# Patient Record
Sex: Male | Born: 1970 | Race: White | Hispanic: No | Marital: Single | Smoking: Never smoker
Health system: Southern US, Community
[De-identification: ages and names within clinical notes are randomized; demographics above are authoritative.]

## PROBLEM LIST (undated history)

## (undated) DIAGNOSIS — E7800 Pure hypercholesterolemia, unspecified: Secondary | ICD-10-CM

---

## 2006-04-27 ENCOUNTER — Emergency Department (HOSPITAL_COMMUNITY): Admission: EM | Admit: 2006-04-27 | Discharge: 2006-04-27 | Payer: Self-pay | Admitting: Emergency Medicine

## 2007-02-03 ENCOUNTER — Emergency Department (HOSPITAL_COMMUNITY): Admission: EM | Admit: 2007-02-03 | Discharge: 2007-02-03 | Payer: Self-pay | Admitting: Emergency Medicine

## 2008-12-08 ENCOUNTER — Emergency Department (HOSPITAL_BASED_OUTPATIENT_CLINIC_OR_DEPARTMENT_OTHER): Admission: EM | Admit: 2008-12-08 | Discharge: 2008-12-08 | Payer: Self-pay | Admitting: Emergency Medicine

## 2008-12-08 ENCOUNTER — Ambulatory Visit: Payer: Self-pay | Admitting: Diagnostic Radiology

## 2011-02-03 LAB — I-STAT 8, (EC8 V) (CONVERTED LAB)
Bicarbonate: 22.1
Chloride: 105
Glucose, Bld: 100 — ABNORMAL HIGH
Glucose, Bld: 79
HCT: 46
HCT: 72 — ABNORMAL HIGH
Hemoglobin: 15.6
Operator id: 265201
Potassium: 3.2 — ABNORMAL LOW
Potassium: 3.8
Sodium: 138
Sodium: 138
TCO2: 25
pCO2, Ven: 41.8 — ABNORMAL LOW

## 2011-02-03 LAB — CBC
HCT: 43.8
Hemoglobin: 15.1
MCHC: 34.5

## 2011-02-03 LAB — DIFFERENTIAL
Basophils Relative: 1
Eosinophils Absolute: 0.1
Eosinophils Relative: 1
Lymphocytes Relative: 33
Monocytes Absolute: 0.7
Monocytes Relative: 12 — ABNORMAL HIGH
Neutrophils Relative %: 54

## 2011-02-03 LAB — URINALYSIS, ROUTINE W REFLEX MICROSCOPIC
Glucose, UA: NEGATIVE
Protein, ur: NEGATIVE
Specific Gravity, Urine: 1.013

## 2011-02-03 LAB — POCT I-STAT CREATININE: Creatinine, Ser: 1

## 2011-02-03 LAB — URINE MICROSCOPIC-ADD ON

## 2016-03-29 MED FILL — BUPROPION HCL XL 300 MG TAB: 300 | 90 days supply | Qty: 90 | Fill #0

## 2016-03-29 MED FILL — OMEPRAZOLE 20 MG CAPSULE DR: 20 | 90 days supply | Qty: 90 | Fill #0

## 2016-07-04 MED FILL — ATORVASTATIN 80 MG TABLET: 80 | 90 days supply | Qty: 90 | Fill #0

## 2016-07-28 MED FILL — BUPROPION HCL XL 300 MG TAB: 300 | 90 days supply | Qty: 90 | Fill #1

## 2016-07-28 MED FILL — OMEPRAZOLE 20 MG CAPSULE DR: 20 | 90 days supply | Qty: 90 | Fill #1

## 2016-08-02 ENCOUNTER — Encounter (HOSPITAL_COMMUNITY): Payer: Self-pay | Admitting: *Deleted

## 2016-08-02 ENCOUNTER — Ambulatory Visit (HOSPITAL_COMMUNITY)
Admission: EM | Admit: 2016-08-02 | Discharge: 2016-08-02 | Disposition: A | Discharge status: Home / Self Care | Payer: 59 | Attending: Internal Medicine | Admitting: Internal Medicine

## 2016-08-02 DIAGNOSIS — J40 Bronchitis, not specified as acute or chronic: Secondary | ICD-10-CM

## 2016-08-02 DIAGNOSIS — R062 Wheezing: Secondary | ICD-10-CM

## 2016-08-02 DIAGNOSIS — R05 Cough: Secondary | ICD-10-CM

## 2016-08-02 HISTORY — DX: Pure hypercholesterolemia, unspecified: E78.00

## 2016-08-02 MED ORDER — ALBUTEROL SULFATE HFA 108 (90 BASE) MCG/ACT IN AERS: 1.0000 | INHALATION_SPRAY | Freq: Four times a day (QID) | RESPIRATORY_TRACT | 0 refills | Status: AC | PRN

## 2016-08-02 MED ORDER — DM-GUAIFENESIN ER 30-600 MG PO TB12: 1.0000 | ORAL_TABLET | Freq: Two times a day (BID) | ORAL | 0 refills | Status: DC

## 2016-08-02 MED ORDER — METHYLPREDNISOLONE 4 MG PO TBPK: ORAL_TABLET | ORAL | 0 refills | Status: DC

## 2016-08-02 MED ORDER — AZITHROMYCIN 250 MG PO TABS: 250.0000 mg | ORAL_TABLET | Freq: Every day | ORAL | 0 refills | Status: DC

## 2016-08-02 MED ORDER — ALBUTEROL SULFATE (2.5 MG/3ML) 0.083% IN NEBU
2.5000 mg | INHALATION_SOLUTION | Freq: Once | RESPIRATORY_TRACT | Status: AC
  Administered 2016-08-02: 2.5 mg via RESPIRATORY_TRACT

## 2016-08-02 MED ORDER — ALBUTEROL SULFATE (2.5 MG/3ML) 0.083% IN NEBU
INHALATION_SOLUTION | RESPIRATORY_TRACT | Status: AC
  Filled 2016-08-02: qty 3

## 2016-08-02 MED FILL — AZITHROMYCIN 250 MG TABLET: 250 | 5 days supply | Qty: 6 | Fill #0

## 2016-08-02 MED FILL — VENTOLIN HFA 90 MCG INHALER: 108 (90 BAS | 25 days supply | Qty: 18 | Fill #0

## 2016-08-02 MED FILL — METHYLPREDNISOLONE 4 MG TAB: 4 | 6 days supply | Qty: 21 | Fill #0

## 2016-08-02 NOTE — Discharge Instructions (Signed)
Pt given ABX Rx with instructions to hold x 48-72 hrs. If no improvement or Sx worsens start the antibiotic as prescribed.

## 2016-08-02 NOTE — ED Triage Notes (Signed)
Cough  X  3   Weeks    Cough  Has  Became  Productive    Several  Days  Ago  He  Developed  A  Fever       Pt   Reports       No  Other  Symptoms  Pt  Is   Sitting upright on  The  Exam table in no  Acute  Distress   Speaking in   Complete   sentances

## 2016-08-02 NOTE — ED Provider Notes (Signed)
CSN: [id number]     Arrival date & time 08/02/16  1047 History   None    Chief Complaint  Patient presents with  . Cough   (Consider location/radiation/quality/duration/timing/severity/associated sxs/prior Treatment) The history is provided by the patient.  Cough  Cough characteristics:  Productive Sputum characteristics:  Yellow and clear Severity:  Moderate Onset quality:  Gradual Duration:  3 weeks Timing:  Constant Progression:  Worsening Chronicity:  New Relieved by:  Nothing : 46 y/o male presented with CC of non productive cough x 3 weeks, productive with yellow sputum since Sunday. Denies fever/chills,SOB,CP. Pt A&Ox3. No acute distress. Vitals WDL.  Past Medical History:  Diagnosis Date  . Hypercholesteremia    History reviewed. No pertinent surgical history. History reviewed. No pertinent family history. Social History  Substance Use Topics  . Smoking status: Never Smoker  . Smokeless tobacco: Not on file  . Alcohol use Yes     Comment: occasonally    Review of Systems  Constitutional: Negative.   HENT: Negative.   Eyes: Negative.   Respiratory: Positive for cough.   Gastrointestinal: Negative.   Endocrine: Negative.   Genitourinary: Negative.     Allergies  Patient has no known allergies.  Home Medications   Prior to Admission medications   Medication Sig Start Date End Date Taking? Authorizing Provider  atorvastatin (LIPITOR) 80 MG tablet Take 80 mg by mouth daily.   Yes Historical Provider, MD  BuPROPion HCl (WELLBUTRIN PO) Take by mouth.   Yes Historical Provider, MD  fluticasone (FLONASE) 50 MCG/ACT nasal spray Place into both nostrils daily.   Yes Historical Provider, MD  omeprazole (PRILOSEC) 20 MG capsule Take 20 mg by mouth daily.   Yes Historical Provider, MD  albuterol (PROVENTIL HFA;VENTOLIN HFA) 108 (90 Base) MCG/ACT inhaler Inhale 1-2 puffs into the lungs every 6 (six) hours as needed for wheezing or shortness of breath. 08/02/16    Todd Crocker, NP  azithromycin (ZITHROMAX) 250 MG tablet Take 1 tablet (250 mg total) by mouth daily. Take first 2 tablets together, then 1 every day until finished. 08/02/16   Todd Zaccone, NP  dextromethorphan-guaiFENesin (MUCINEX DM) 30-600 MG 12hr tablet Take 1 tablet by mouth 2 (two) times daily. 08/02/16   Todd Mesquita, NP  methylPREDNISolone (MEDROL DOSEPAK) 4 MG TBPK tablet Take as directed 08/02/16   Todd Mcnamara, NP   Meds Ordered and Administered this Visit   Medications  albuterol (PROVENTIL) (2.5 MG/3ML) 0.083% nebulizer solution 2.5 mg (2.5 mg Nebulization Given 08/02/16 1215)    BP 125/82 (BP Location: Right Arm)   Pulse 78   Temp 98.1 F (36.7 C) (Oral)   Resp 18   SpO2 96%  No data found.   Physical Exam  Constitutional: He appears well-developed and well-nourished. No distress.  HENT:  Head: Normocephalic.  Eyes: Pupils are equal, round, and reactive to light.  Neck: Normal range of motion.  Cardiovascular: Normal rate, regular rhythm and normal heart sounds.   Pulmonary/Chest: Effort normal. No respiratory distress. He has wheezes (Diffuse expiratory wheezing. ). He has no rales. He exhibits no tenderness.  Abdominal: Soft.    Urgent Care Course     Procedures (including critical care time)  Labs Review Labs Reviewed - No data to display  Imaging Review No results found.   Visual Acuity Review  Right Eye Distance:   Left Eye Distance:   Bilateral Distance:    Right Eye Near:   Left Eye Near:    Bilateral Near:  MDM   1. Bronchitis   2. Wheezing   Pt given ABX Rx with instructions to hold x 48-72 hrs. If no improvement or Sx worsens start the antibiotic as prescribed.   Todd Carbonneau, NP 08/02/16 1219

## 2016-10-18 DIAGNOSIS — G4700 Insomnia, unspecified: Secondary | ICD-10-CM | POA: Diagnosis not present

## 2016-10-18 DIAGNOSIS — Z0000 Encounter for general adult medical examination without abnormal findings: Secondary | ICD-10-CM | POA: Diagnosis not present

## 2016-10-18 MED FILL — ATORVASTATIN 80 MG TABLET: 80 | 90 days supply | Qty: 90 | Fill #1

## 2016-10-19 MED FILL — OMEPRAZOLE 20 MG CAPSULE DR: 20 | 90 days supply | Qty: 90 | Fill #0

## 2016-10-19 MED FILL — BUPROPION HCL XL 300 MG TAB: 300 | 90 days supply | Qty: 90 | Fill #0

## 2016-11-23 DIAGNOSIS — G4700 Insomnia, unspecified: Secondary | ICD-10-CM | POA: Diagnosis not present

## 2016-11-23 DIAGNOSIS — Z9189 Other specified personal risk factors, not elsewhere classified: Secondary | ICD-10-CM | POA: Diagnosis not present

## 2016-11-23 DIAGNOSIS — F330 Major depressive disorder, recurrent, mild: Secondary | ICD-10-CM | POA: Diagnosis not present

## 2016-11-23 MED FILL — TRUVADA 200-300 MG TABS: 200-300 | 30 days supply | Qty: 30 | Fill #0

## 2016-12-30 MED FILL — TRUVADA 200-300 MG TABS: 200-300 | 30 days supply | Qty: 30 | Fill #1

## 2017-01-18 DIAGNOSIS — H524 Presbyopia: Secondary | ICD-10-CM | POA: Diagnosis not present

## 2017-01-18 DIAGNOSIS — H52223 Regular astigmatism, bilateral: Secondary | ICD-10-CM | POA: Diagnosis not present

## 2017-01-18 DIAGNOSIS — H5203 Hypermetropia, bilateral: Secondary | ICD-10-CM | POA: Diagnosis not present

## 2017-01-26 MED FILL — OMEPRAZOLE 20 MG CAP: 20 | 90 days supply | Qty: 90 | Fill #1

## 2017-01-26 MED FILL — BUPROPION HCL XL 300 MG TAB: 300 | 90 days supply | Qty: 90 | Fill #1

## 2017-01-26 MED FILL — ATORVASTATIN 80 MG TABLET: 80 | 90 days supply | Qty: 90 | Fill #2

## 2017-02-07 ENCOUNTER — Telehealth: Payer: Self-pay | Admitting: Pharmacy Technician

## 2017-02-07 NOTE — Telephone Encounter (Signed)
[facility] returned to stock.  Did not pick up, call [phone] to refill

## 2017-04-20 MED FILL — OMEPRAZOLE 20 MG CAP: 20 | 90 days supply | Qty: 90 | Fill #2

## 2017-04-20 MED FILL — ATORVASTATIN 80 MG TABLET: 80 | 90 days supply | Qty: 90 | Fill #3

## 2017-04-20 MED FILL — BUPROPION HCL XL 300 MG TAB: 300 | 90 days supply | Qty: 90 | Fill #2

## 2017-08-17 MED FILL — OMEPRAZOLE 20 MG CAP: 20 | 90 days supply | Qty: 90 | Fill #3

## 2017-08-17 MED FILL — ATORVASTATIN 80 MG TABLET: 80 | 90 days supply | Qty: 90 | Fill #0

## 2017-08-17 MED FILL — BuPROPion HCL ER (XL) 300 M: 300 | 90 days supply | Qty: 90 | Fill #3

## 2017-10-09 ENCOUNTER — Encounter: Payer: Self-pay | Admitting: Emergency Medicine

## 2017-10-09 ENCOUNTER — Emergency Department (INDEPENDENT_AMBULATORY_CARE_PROVIDER_SITE_OTHER): Payer: 59

## 2017-10-09 ENCOUNTER — Emergency Department
Admission: EM | Admit: 2017-10-09 | Discharge: 2017-10-09 | Disposition: A | Discharge status: Home / Self Care | Payer: 59 | Source: Home / Self Care | Attending: Family Medicine | Admitting: Family Medicine

## 2017-10-09 ENCOUNTER — Other Ambulatory Visit: Payer: Self-pay

## 2017-10-09 DIAGNOSIS — M7052 Other bursitis of knee, left knee: Secondary | ICD-10-CM | POA: Diagnosis not present

## 2017-10-09 DIAGNOSIS — M25561 Pain in right knee: Secondary | ICD-10-CM

## 2017-10-09 MED ORDER — PREDNISONE 20 MG PO TABS: ORAL_TABLET | ORAL | 0 refills | Status: DC

## 2017-10-09 MED ORDER — TRAMADOL HCL 50 MG PO TABS: 50.0000 mg | ORAL_TABLET | Freq: Four times a day (QID) | ORAL | 0 refills | Status: AC | PRN

## 2017-10-09 MED FILL — traMADol HCL 50 MG TABS: 50 | 3 days supply | Qty: 15 | Fill #0

## 2017-10-09 MED FILL — predniSONE 20 MG TABS: 20 | 7 days supply | Qty: 11 | Fill #0

## 2017-10-09 NOTE — ED Triage Notes (Signed)
Patient has been running/walking for past 3 months; about 10 days ago noticed pain in right knee and this has progressed; presented using cane and brought back to room in w/c. He has been taking Aleve but did not take any this morning.

## 2017-10-09 NOTE — ED Provider Notes (Signed)
[facility]    CSN: [id number] Arrival date & time: 10/09/17  0846     History   Chief Complaint Chief Complaint  Patient presents with  . Knee Pain    right    HPI Todd Velazquez is a 47 y.o. male.   Patient started running about 3 months ago.  During the past 10 days he has developed increasing pain in the medial aspect of his knee.  During the past 2 to 3 days it feels as if it may give way.  He recalls no injury.  The history is provided by the patient.  Knee Pain  Location:  Knee Time since incident:  10 days Injury: no   Knee location:  R knee Pain details:    Quality:  Aching   Radiates to:  Does not radiate   Severity:  Severe   Onset quality:  Gradual   Duration:  10 days   Timing:  Constant   Progression:  Worsening Chronicity:  New Dislocation: no   Prior injury to area:  No Relieved by:  Nothing Worsened by:  Bearing weight, activity and exercise Ineffective treatments:  NSAIDs Associated symptoms: no back pain, no decreased ROM, no muscle weakness, no numbness, no stiffness, no swelling and no tingling     Past Medical History:  Diagnosis Date  . Hypercholesteremia     There are no active problems to display for this patient.   History reviewed. No pertinent surgical history.     Home Medications    Prior to Admission medications   Medication Sig Start Date End Date Taking? Authorizing Provider  albuterol (PROVENTIL HFA;VENTOLIN HFA) 108 (90 Base) MCG/ACT inhaler Inhale 1-2 puffs into the lungs every 6 (six) hours as needed for wheezing or shortness of breath. 08/02/16   Multani, Bhupinder, NP  atorvastatin (LIPITOR) 80 MG tablet Take 80 mg by mouth daily.    [provider]  BuPROPion HCl (WELLBUTRIN PO) Take by mouth.    [provider]  fluticasone (FLONASE) 50 MCG/ACT nasal spray Place into both nostrils daily.    [provider]  omeprazole (PRILOSEC) 20 MG capsule Take 20 mg by mouth daily.     [provider]  predniSONE (DELTASONE) 20 MG tablet Take one tab by mouth twice daily for 4 days, then one daily for 3 days. Take with food. 10/09/17   Kandra Nicolas, MD  traMADol (ULTRAM) 50 MG tablet Take 1 tablet (50 mg total) by mouth every 6 (six) hours as needed for up to 5 days for moderate pain. 10/09/17 10/14/17  Kandra Nicolas, MD    Family History No family history on file.  Social History Social History   Tobacco Use  . Smoking status: Never Smoker  . Smokeless tobacco: Never Used  Substance Use Topics  . Alcohol use: Yes    Comment: occasonally  . Drug use: Not on file     Allergies   Patient has no known allergies.   Review of Systems Review of Systems  Musculoskeletal: Negative for back pain and stiffness.  All other systems reviewed and are negative.    Physical Exam Triage Vital Signs ED Triage Vitals  Enc Vitals Group     BP 10/09/17 0908 124/83     Pulse Rate 10/09/17 0908 69     Resp 10/09/17 0908 16     Temp 10/09/17 0908 97.8 F (36.6 C)     Temp Source 10/09/17 0908 Oral  SpO2 10/09/17 0908 99 %     Weight 10/09/17 0909 165 lb (74.8 kg)     Height 10/09/17 0909 5\' 6"  (1.676 m)     Head Circumference --      Peak Flow --      Pain Score 10/09/17 0909 7     Pain Loc --      Pain Edu? --      Excl. in [place]? --    No data found.  Updated Vital Signs BP 124/83 (BP Location: Right Arm)   Pulse 69   Temp 97.8 F (36.6 C) (Oral)   Resp 16   Ht 5\' 6"  (1.676 m)   Wt 165 lb (74.8 kg)   SpO2 99%   BMI 26.63 kg/m   Visual Acuity Right Eye Distance:   Left Eye Distance:   Bilateral Distance:    Right Eye Near:   Left Eye Near:    Bilateral Near:     Physical Exam  Constitutional: He appears well-developed and well-nourished. No distress.  HENT:  Head: Atraumatic.  Eyes: Pupils are equal, round, and reactive to light.  Cardiovascular: Normal rate.  Pulmonary/Chest: Effort normal.  Musculoskeletal: He exhibits no  edema.       Right knee: He exhibits normal range of motion, no swelling, no effusion, no ecchymosis, no deformity, no laceration, no erythema, normal alignment, no LCL laxity and normal meniscus. Tenderness found. No medial joint line, no lateral joint line, no MCL, no LCL and no patellar tendon tenderness noted.       Legs: Right knee:  No effusion, erythema, or warmth.  Knee stable, negative drawer test.  McMurray test negative.  Distinct tenderness to palpation over the right pes anserine bursa. Right iliotibial band has distinct click, but there is no tenderness to palpation over this area.  Neurological: He is alert.  Skin: Skin is warm and dry.  Nursing note and vitals reviewed.    UC Treatments / Results  Labs (all labs ordered are listed, but only abnormal results are displayed) Labs Reviewed - No data to display  EKG None  Radiology Dg Knee Complete 4 Views Right  Result Date: 10/09/2017 CLINICAL DATA:  Right knee pain for 10 days. No specific injury. Runner. EXAM: RIGHT KNEE - COMPLETE 4+ VIEW COMPARISON:  None. FINDINGS: The mineralization and alignment are normal. There is no evidence of acute fracture or dislocation. The joint spaces are maintained. There is no significant joint effusion, although there is possible mild edema in Hoffa's fat on the lateral view. No focal soft tissue swelling identified. IMPRESSION: Possible edema in Hoffa's fat as can be seen with synovitis. No acute osseous findings. Electronically Signed   By: Richardean Sale M.D.   On: 10/09/2017 10:07    Procedures Procedures (including critical care time)  Medications Ordered in UC Medications - No data to display  Initial Impression / Assessment and Plan / UC Course  I have reviewed the triage vital signs and the nursing notes.  Pertinent labs & imaging results that were available during my care of the patient were reviewed by me and considered in my medical decision making (see chart for  details).    Suspect a component of Hoffa's fat pad impingement as well. Dispensed crutches.  Begin prednisone burst/taper. Rx for tramadol. Controlled Substance Prescriptions I have consulted the [place] Controlled Substances Registry for this patient, and feel the risk/benefit ratio today is favorable for proceeding with this prescription for a controlled substance.  Followup with Dr. Aundria Mems or Dr. Lynne Leader ([facility]) if not improving about two weeks.    Final Clinical Impressions(s) / UC Diagnoses   Final diagnoses:  Pes anserinus bursitis of left knee     Discharge Instructions     Apply ice pack for 20 to 30 minutes, 3 to 4 times daily  Continue until pain and swelling decrease.  Begin range of motion and stretching exercises as tolerated.  Continue knee sleeve for support.  May use crutches for about 5 to 7 days.    ED Prescriptions    Medication Sig Dispense Auth. Provider   traMADol (ULTRAM) 50 MG tablet Take 1 tablet (50 mg total) by mouth every 6 (six) hours as needed for up to 5 days for moderate pain. 15 tablet Kandra Nicolas, MD   predniSONE (DELTASONE) 20 MG tablet Take one tab by mouth twice daily for 4 days, then one daily for 3 days. Take with food. 11 tablet Kandra Nicolas, MD         Kandra Nicolas, MD 10/09/17 1124

## 2017-10-09 NOTE — Discharge Instructions (Addendum)
Apply ice pack for 20 to 30 minutes, 3 to 4 times daily  Continue until pain and swelling decrease.  Begin range of motion and stretching exercises as tolerated.  Continue knee sleeve for support.  May use crutches for about 5 to 7 days.

## 2017-10-11 DIAGNOSIS — K7689 Other specified diseases of liver: Secondary | ICD-10-CM | POA: Diagnosis not present

## 2017-10-11 DIAGNOSIS — K626 Ulcer of anus and rectum: Secondary | ICD-10-CM | POA: Diagnosis not present

## 2017-10-11 DIAGNOSIS — G4700 Insomnia, unspecified: Secondary | ICD-10-CM | POA: Diagnosis not present

## 2017-10-11 DIAGNOSIS — F330 Major depressive disorder, recurrent, mild: Secondary | ICD-10-CM | POA: Diagnosis not present

## 2017-10-11 DIAGNOSIS — E785 Hyperlipidemia, unspecified: Secondary | ICD-10-CM | POA: Diagnosis not present

## 2017-10-11 DIAGNOSIS — Z9189 Other specified personal risk factors, not elsewhere classified: Secondary | ICD-10-CM | POA: Diagnosis not present

## 2017-10-11 DIAGNOSIS — Z0000 Encounter for general adult medical examination without abnormal findings: Secondary | ICD-10-CM | POA: Diagnosis not present

## 2017-10-11 DIAGNOSIS — M25561 Pain in right knee: Secondary | ICD-10-CM | POA: Diagnosis not present

## 2017-10-11 DIAGNOSIS — K219 Gastro-esophageal reflux disease without esophagitis: Secondary | ICD-10-CM | POA: Diagnosis not present

## 2017-10-11 MED FILL — raNITIdine HCL 150 MG TABS: 150 | 14 days supply | Qty: 28 | Fill #0

## 2017-10-12 MED FILL — ATORVASTATIN 40 MG TABLET: 40 | 90 days supply | Qty: 90 | Fill #0

## 2017-12-06 MED FILL — buPROPion HCL ER (XL) 300 M: 300 | 90 days supply | Qty: 90 | Fill #0

## 2017-12-06 MED FILL — OMEPRAZOLE 20 MG CPDR: 20 | 90 days supply | Qty: 90 | Fill #0

## 2018-02-26 MED FILL — ATORVASTATIN 40 MG TABLET: 40 | 90 days supply | Qty: 90 | Fill #1

## 2018-02-27 MED FILL — OMEPRAZOLE 20 MG CPDR: 20 | 90 days supply | Qty: 90 | Fill #1

## 2018-02-27 MED FILL — buPROPion HCL ER (XL) 300 M: 300 | 90 days supply | Qty: 90 | Fill #1

## 2018-05-27 MED FILL — buPROPion HCL ER (XL) 300 M: 300 | 90 days supply | Qty: 90 | Fill #2

## 2018-05-27 MED FILL — OMEPRAZOLE 20 MG CPDR: 20 | 90 days supply | Qty: 90 | Fill #2

## 2018-05-28 MED FILL — ATORVASTATIN 40 MG TABLET: 40 | 90 days supply | Qty: 90 | Fill #2

## 2018-08-28 MED FILL — ATORVASTATIN 40 MG TABLET: 40 | 90 days supply | Qty: 90 | Fill #3

## 2018-08-28 MED FILL — OMEPRAZOLE 20 MG CPDR: 20 | 90 days supply | Qty: 90 | Fill #3

## 2018-08-28 MED FILL — buPROPion HCL ER (XL) 300 M: 300 | 90 days supply | Qty: 90 | Fill #3

## 2018-11-20 MED FILL — ATORVASTATIN 40 MG TABLET: 40 | 90 days supply | Qty: 90 | Fill #0

## 2018-11-23 DIAGNOSIS — Z9189 Other specified personal risk factors, not elsewhere classified: Secondary | ICD-10-CM | POA: Diagnosis not present

## 2018-11-23 DIAGNOSIS — E785 Hyperlipidemia, unspecified: Secondary | ICD-10-CM | POA: Diagnosis not present

## 2018-11-23 DIAGNOSIS — Z0000 Encounter for general adult medical examination without abnormal findings: Secondary | ICD-10-CM | POA: Diagnosis not present

## 2018-11-23 DIAGNOSIS — M722 Plantar fascial fibromatosis: Secondary | ICD-10-CM | POA: Diagnosis not present

## 2018-11-23 DIAGNOSIS — F320 Major depressive disorder, single episode, mild: Secondary | ICD-10-CM | POA: Diagnosis not present

## 2018-11-23 MED FILL — NAPROXEN 500 MG TABLET: 500 | 30 days supply | Qty: 60 | Fill #0

## 2018-11-23 MED FILL — CITALOPRAM HBR 10 MG TABLET: 10 | 30 days supply | Qty: 30 | Fill #0

## 2018-11-26 MED FILL — OMEPRAZOLE 20 MG CAP: 20 | 90 days supply | Qty: 90 | Fill #0

## 2018-11-26 MED FILL — buPROPion HCL ER (XL) 150 M: 150 | 30 days supply | Qty: 30 | Fill #0

## 2019-02-20 MED FILL — OMEPRAZOLE 20 MG CAP: 20 | 90 days supply | Qty: 90 | Fill #1

## 2019-02-20 MED FILL — ATORVASTATIN 40 MG TABLET: 40 | 90 days supply | Qty: 90 | Fill #1

## 2019-02-20 MED FILL — buPROPion HCL ER (XL) 150 M: 150 | 30 days supply | Qty: 30 | Fill #2

## 2019-02-26 DIAGNOSIS — M722 Plantar fascial fibromatosis: Secondary | ICD-10-CM | POA: Diagnosis not present

## 2019-02-26 DIAGNOSIS — K219 Gastro-esophageal reflux disease without esophagitis: Secondary | ICD-10-CM | POA: Diagnosis not present

## 2019-02-26 DIAGNOSIS — F320 Major depressive disorder, single episode, mild: Secondary | ICD-10-CM | POA: Diagnosis not present

## 2019-02-26 DIAGNOSIS — E785 Hyperlipidemia, unspecified: Secondary | ICD-10-CM | POA: Diagnosis not present

## 2019-02-26 MED FILL — NAPROXEN 500 MG TABS: 500 | 30 days supply | Qty: 60 | Fill #0

## 2019-05-16 MED FILL — OMEPRAZOLE 20 MG CAP: 20 | 90 days supply | Qty: 90 | Fill #2

## 2019-05-16 MED FILL — buPROPion HCL ER (XL) 150 M: 150 | 90 days supply | Qty: 90 | Fill #0

## 2019-05-16 MED FILL — ATORVASTATIN 40 MG TABLET: 40 | 90 days supply | Qty: 90 | Fill #2

## 2019-07-15 MED FILL — AMOX-CLAV 875-125 MG TABLET: 875-125 | 7 days supply | Qty: 14 | Fill #0

## 2019-07-17 MED FILL — NAPROXEN 500 MG TABS: 500 | 30 days supply | Qty: 60 | Fill #1

## 2019-08-11 MED FILL — ATORVASTATIN 40 MG TABLET: 40 | 90 days supply | Qty: 90 | Fill #3

## 2019-08-11 MED FILL — buPROPion HCL ER (XL) 150 M: 150 | 90 days supply | Qty: 90 | Fill #1

## 2019-08-11 MED FILL — OMEPRAZOLE 20 MG CAP: 20 | 90 days supply | Qty: 90 | Fill #3

## 2019-10-18 MED FILL — NAPROXEN 500 MG TABS: 500 | 30 days supply | Qty: 60 | Fill #2

## 2019-11-05 DIAGNOSIS — Z0000 Encounter for general adult medical examination without abnormal findings: Secondary | ICD-10-CM | POA: Diagnosis not present

## 2019-11-05 DIAGNOSIS — K219 Gastro-esophageal reflux disease without esophagitis: Secondary | ICD-10-CM | POA: Diagnosis not present

## 2019-11-05 DIAGNOSIS — F320 Major depressive disorder, single episode, mild: Secondary | ICD-10-CM | POA: Diagnosis not present

## 2019-11-05 DIAGNOSIS — E785 Hyperlipidemia, unspecified: Secondary | ICD-10-CM | POA: Diagnosis not present

## 2019-11-05 MED FILL — buPROPion HCL ER (XL) 300 M: 300 | 30 days supply | Qty: 30 | Fill #0

## 2019-11-11 ENCOUNTER — Other Ambulatory Visit (HOSPITAL_COMMUNITY): Payer: Self-pay | Admitting: Family Medicine

## 2019-11-11 MED FILL — ATORVASTATIN CALCIUM 40 MG: 40 | 90 days supply | Qty: 90 | Fill #0

## 2019-11-11 MED FILL — OMEPRAZOLE 20 MG CAP: 20 | 90 days supply | Qty: 90 | Fill #0

## 2019-11-20 ENCOUNTER — Emergency Department (INDEPENDENT_AMBULATORY_CARE_PROVIDER_SITE_OTHER)
Admission: EM | Admit: 2019-11-20 | Discharge: 2019-11-20 | Disposition: A | Discharge status: Home / Self Care | Payer: 59 | Source: Home / Self Care

## 2019-11-20 ENCOUNTER — Other Ambulatory Visit: Payer: Self-pay

## 2019-11-20 DIAGNOSIS — L0100 Impetigo, unspecified: Secondary | ICD-10-CM | POA: Diagnosis not present

## 2019-11-20 DIAGNOSIS — J340 Abscess, furuncle and carbuncle of nose: Secondary | ICD-10-CM

## 2019-11-20 MED ORDER — AMOXICILLIN 500 MG PO CAPS: 500.0000 mg | ORAL_CAPSULE | Freq: Three times a day (TID) | ORAL | 0 refills | Status: DC

## 2019-11-20 MED ORDER — MUPIROCIN 2 % EX OINT: TOPICAL_OINTMENT | CUTANEOUS | 0 refills | Status: AC

## 2019-11-20 NOTE — ED Provider Notes (Signed)
[facility]    CSN: [id number] Arrival date & time: 11/20/19  0940      History   Chief Complaint Chief Complaint  Patient presents with  . Abscess    HPI Todd Velazquez is a 49 y.o. male.   HPI  Todd Velazquez is a 49 y.o. male presenting to UC with c/o gradually worsening red, swollen painful sore on in his Right nostril for 3 days.  Mild yellow crusty discharge this morning. Denies other symptoms- no fever, chills, cough, congestion, sore throat or ear pain. No hx of same.   Past Medical History:  Diagnosis Date  . Hypercholesteremia     There are no problems to display for this patient.   No past surgical history on file.     Home Medications    Prior to Admission medications   Medication Sig Start Date End Date Taking? Authorizing Provider  albuterol (PROVENTIL HFA;VENTOLIN HFA) 108 (90 Base) MCG/ACT inhaler Inhale 1-2 puffs into the lungs every 6 (six) hours as needed for wheezing or shortness of breath. 08/02/16  Yes Multani, Bhupinder, NP  atorvastatin (LIPITOR) 40 MG tablet Take 40 mg by mouth daily. 11/11/19  Yes [provider]  buPROPion (WELLBUTRIN XL) 300 MG 24 hr tablet Take 300 mg by mouth daily. 11/05/19  Yes [provider]  fluticasone (FLONASE) 50 MCG/ACT nasal spray Place into both nostrils daily.   Yes [provider]  omeprazole (PRILOSEC) 20 MG capsule Take 20 mg by mouth daily.   Yes [provider]  amoxicillin (AMOXIL) 500 MG capsule Take 1 capsule (500 mg total) by mouth 3 (three) times daily. 11/20/19   Noe Gens, PA-C  atorvastatin (LIPITOR) 80 MG tablet Take 80 mg by mouth daily.    [provider]  BuPROPion HCl (WELLBUTRIN PO) Take by mouth.    [provider]  mupirocin ointment (BACTROBAN) 2 % Apply to wound 3 times daily for 5 days 11/20/19   Noe Gens, PA-C  predniSONE (DELTASONE) 20 MG tablet Take one tab by mouth twice daily for 4 days, then one daily for 3  days. Take with food. 10/09/17   Kandra Nicolas, MD    Family History No family history on file.  Social History Social History   Tobacco Use  . Smoking status: Never Smoker  . Smokeless tobacco: Never Used  Substance Use Topics  . Alcohol use: Yes    Comment: occasonally  . Drug use: Not on file     Allergies   Codeine   Review of Systems Review of Systems  Constitutional: Negative for chills and fever.  HENT: Negative for congestion, ear pain, sore throat, trouble swallowing and voice change.        Right nose sore  Respiratory: Negative for cough and shortness of breath.   Cardiovascular: Negative for chest pain and palpitations.  Gastrointestinal: Negative for abdominal pain, diarrhea, nausea and vomiting.  Musculoskeletal: Negative for arthralgias, back pain and myalgias.  Skin: Positive for color change. Negative for rash.  All other systems reviewed and are negative.    Physical Exam Triage Vital Signs ED Triage Vitals  Enc Vitals Group     BP 11/20/19 0947 (P) 123/80     Pulse Rate 11/20/19 0947 (P) 85     Resp --      Temp 11/20/19 0947 (P) 98.8 F (37.1 C)     Temp src --      SpO2 11/20/19 0947 (P)  97 %     Weight 11/20/19 0944 182 lb (82.6 kg)     Height 11/20/19 0944 5\' 6"  (1.676 m)     Head Circumference --      Peak Flow --      Pain Score 11/20/19 0944 6     Pain Loc --      Pain Edu? --      Excl. in [place]? --    No data found.  Updated Vital Signs BP (P) 123/80 (BP Location: Right Arm)   Pulse (P) 85   Temp (P) 98.8 F (37.1 C)   Ht 5\' 6"  (1.676 m)   Wt 182 lb (82.6 kg)   SpO2 (P) 97%   BMI 29.38 kg/m   Visual Acuity Right Eye Distance:   Left Eye Distance:   Bilateral Distance:    Right Eye Near:   Left Eye Near:    Bilateral Near:     Physical Exam Vitals and nursing note reviewed.  Constitutional:      Appearance: Normal appearance. He is well-developed.  HENT:     Head: Normocephalic and atraumatic.     Right  Ear: Tympanic membrane and ear canal normal.     Left Ear: Tympanic membrane and ear canal normal.     Nose: Nasal tenderness (Right side) present.     Right Nostril: No septal hematoma.     Left Nostril: No septal hematoma.     Right Sinus: No maxillary sinus tenderness or frontal sinus tenderness.     Left Sinus: No maxillary sinus tenderness or frontal sinus tenderness.   Cardiovascular:     Rate and Rhythm: Normal rate.  Pulmonary:     Effort: Pulmonary effort is normal.  Musculoskeletal:        General: Normal range of motion.     Cervical back: Normal range of motion.  Skin:    General: Skin is warm and dry.  Neurological:     Mental Status: He is alert and oriented to person, place, and time.  Psychiatric:        Behavior: Behavior normal.      UC Treatments / Results  Labs (all labs ordered are listed, but only abnormal results are displayed) Labs Reviewed - No data to display  EKG   Radiology No results found.  Procedures Procedures (including critical care time)  Medications Ordered in UC Medications - No data to display  Initial Impression / Assessment and Plan / UC Course  I have reviewed the triage vital signs and the nursing notes.  Pertinent labs & imaging results that were available during my care of the patient were reviewed by me and considered in my medical decision making (see chart for details).    Will tx with oral and topical antibiotic F/u with PCP in 3-4 days if not improving, sooner if worsening. AVS provided.  Final Clinical Impressions(s) / UC Diagnoses   Final diagnoses:  Impetigo  Abscess of external nose     Discharge Instructions      Please take antibiotics as prescribed and be sure to complete entire course even if you start to feel better to ensure infection does not come back.  You should use a Q-tip to apply the antibiotic ointment as prescribed to the sore inside your nose.  You can apply a warm damp washcloth to  your face 2-3 times daily for 10-15  minutes at a time to encourage the area to drain on its own.  Follow up in 3-4 days if not improving, sooner if worsening.     ED Prescriptions    Medication Sig Dispense Auth. Provider   amoxicillin (AMOXIL) 500 MG capsule Take 1 capsule (500 mg total) by mouth 3 (three) times daily. 21 capsule Leeroy Cha O, PA-C   mupirocin ointment (BACTROBAN) 2 % Apply to wound 3 times daily for 5 days 22 g Noe Gens, [place]     PDMP not reviewed this encounter.   Noe Gens, [place] 11/20/19 1111

## 2019-11-20 NOTE — Discharge Instructions (Signed)
  Please take antibiotics as prescribed and be sure to complete entire course even if you start to feel better to ensure infection does not come back.  You should use a Q-tip to apply the antibiotic ointment as prescribed to the sore inside your nose.  You can apply a warm damp washcloth to your face 2-3 times daily for 10-15  minutes at a time to encourage the area to drain on its own.   Follow up in 3-4 days if not improving, sooner if worsening.

## 2019-11-20 NOTE — ED Triage Notes (Signed)
Pt states that he has a sore in his right nostril that is painful. Pt states that it has been there x3 days. Pt states that he is fully vaccinated.

## 2019-11-21 DIAGNOSIS — J340 Abscess, furuncle and carbuncle of nose: Secondary | ICD-10-CM | POA: Diagnosis not present

## 2019-12-12 MED FILL — buPROPion HCL ER (XL) 300 M: 300 | 30 days supply | Qty: 30 | Fill #1

## 2020-01-22 MED FILL — buPROPion HCL ER (XL) 300 M: 300 | 30 days supply | Qty: 30 | Fill #2

## 2020-02-21 IMAGING — DX DG KNEE COMPLETE 4+V*R*
4 series · 4 of 4 positions shown · non-contrast
Comparison: None.

CLINICAL DATA: Right knee pain for 10 days. No specific injury.
Runner.

EXAM:
RIGHT KNEE - COMPLETE 4+ VIEW

[knee ap]
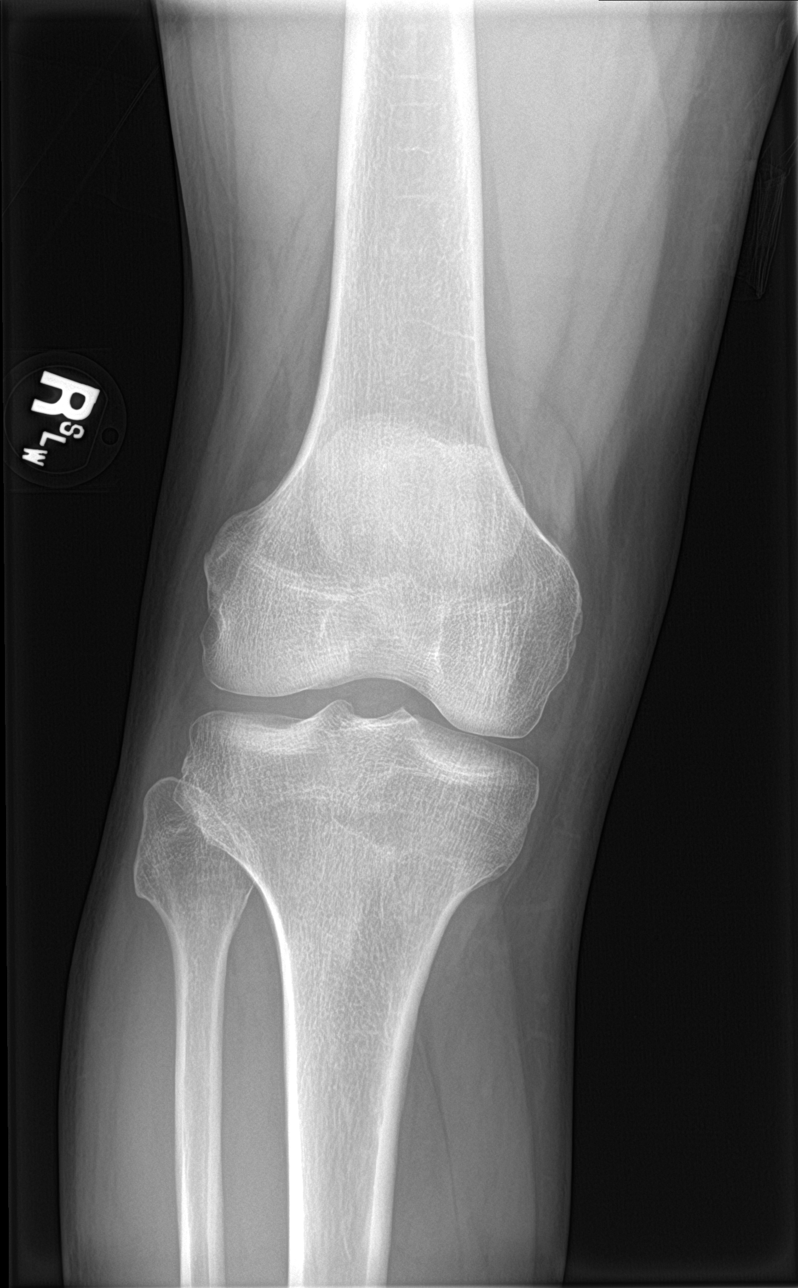

[knee lat]
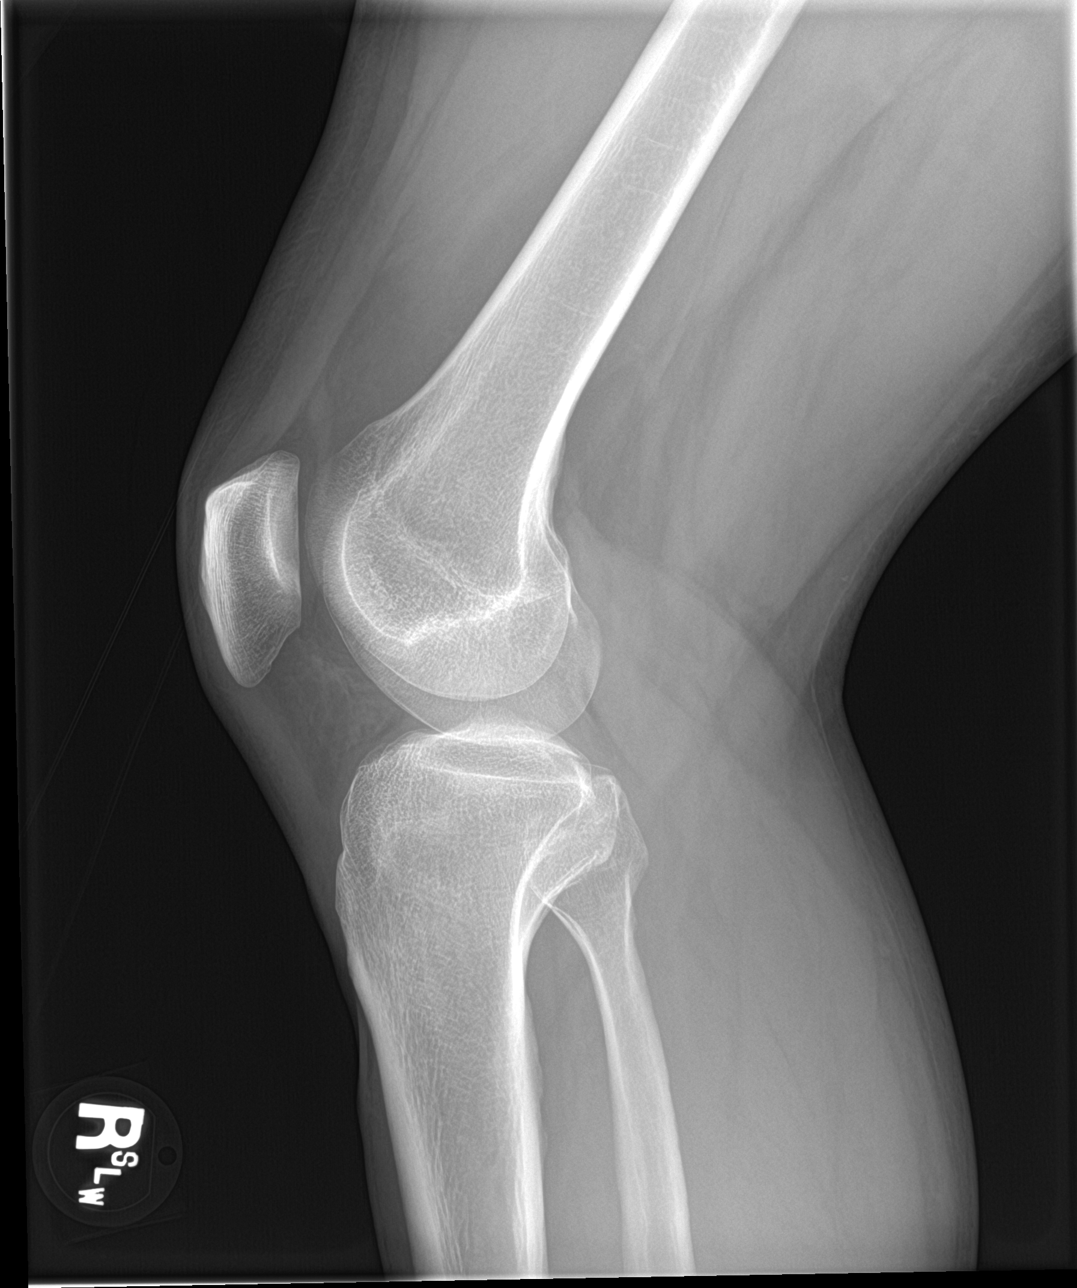

[knee obl (1 of 2)]
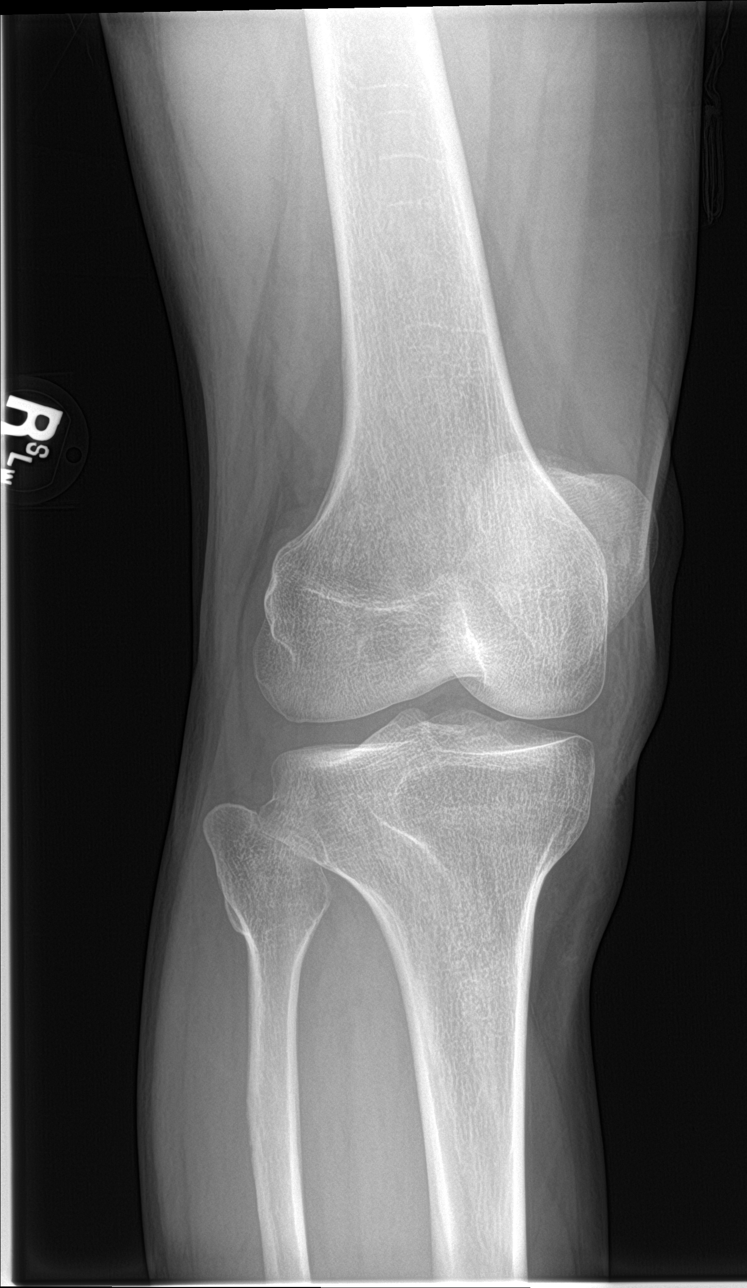

[knee obl (2 of 2)]
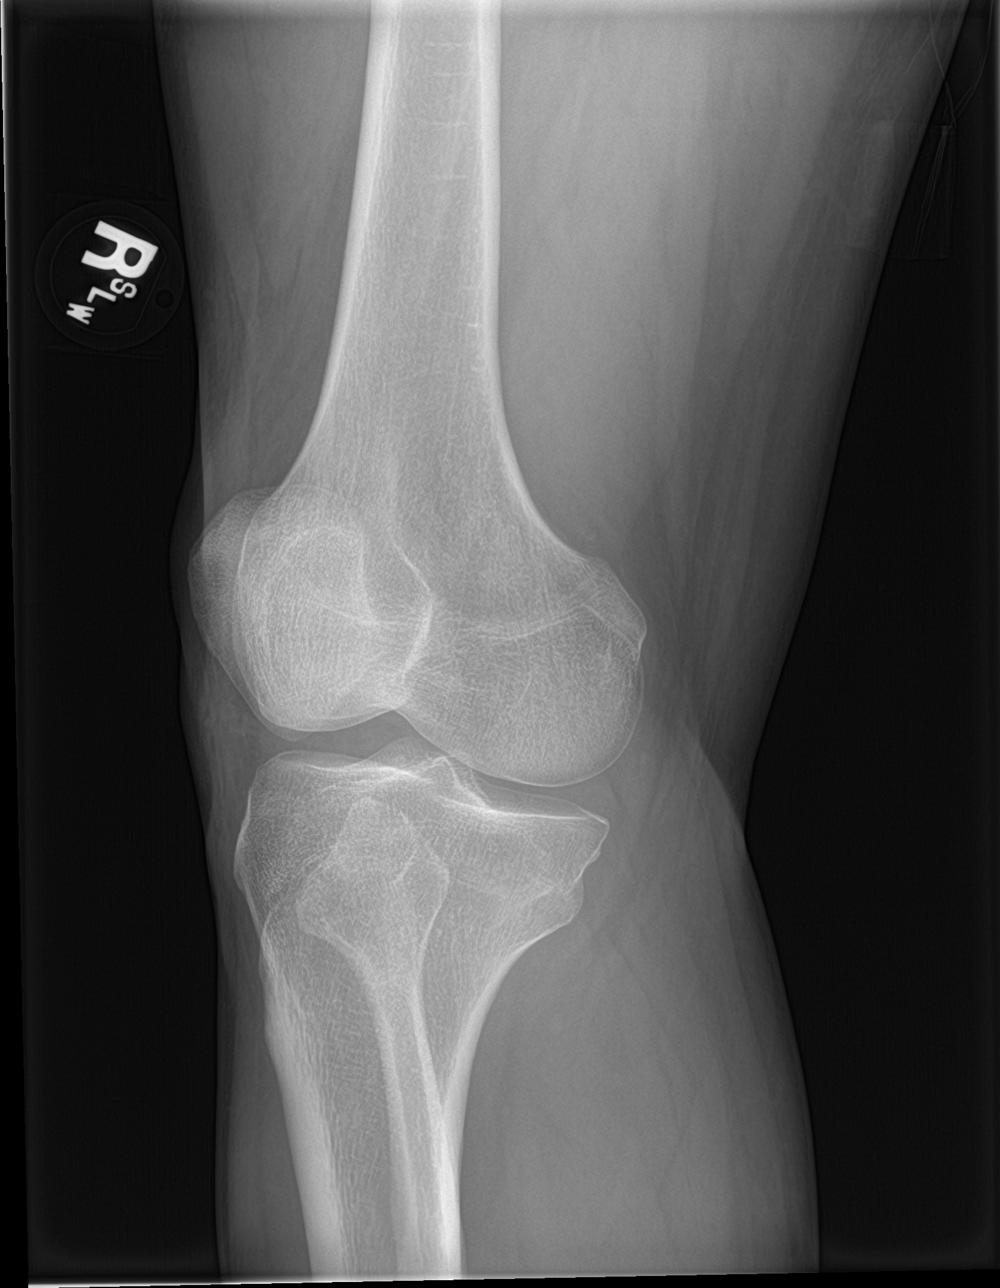

[4 of 4 positions shown; findings below may reference images not displayed]

FINDINGS: The mineralization and alignment are normal. There is no evidence of
acute fracture or dislocation. The joint spaces are maintained.
There is no significant joint effusion, although there is possible
mild edema in Hoffa's fat on the lateral view. No focal soft tissue
swelling identified.
IMPRESSION: Possible edema in Hoffa's fat as can be seen with synovitis. No
acute osseous findings.

## 2020-02-24 ENCOUNTER — Other Ambulatory Visit (HOSPITAL_COMMUNITY): Payer: Self-pay | Admitting: Family Medicine

## 2020-02-24 MED FILL — buPROPion HCL ER (XL) 300 M: 300 | 90 days supply | Qty: 90 | Fill #0

## 2020-02-24 MED FILL — ATORVASTATIN 40 MG TABLET: 40 | 90 days supply | Qty: 90 | Fill #1

## 2020-02-24 MED FILL — OMEPRAZOLE 20 MG CAP: 20 | 90 days supply | Qty: 90 | Fill #1

## 2020-03-10 DIAGNOSIS — Y92000 Kitchen of unspecified non-institutional (private) residence as  the place of occurrence of the external cause: Secondary | ICD-10-CM | POA: Diagnosis not present

## 2020-03-10 DIAGNOSIS — T25111A Burn of first degree of right ankle, initial encounter: Secondary | ICD-10-CM | POA: Diagnosis not present

## 2020-03-10 DIAGNOSIS — Y999 Unspecified external cause status: Secondary | ICD-10-CM | POA: Diagnosis not present

## 2020-03-10 DIAGNOSIS — X12XXXA Contact with other hot fluids, initial encounter: Secondary | ICD-10-CM | POA: Diagnosis not present

## 2020-03-10 DIAGNOSIS — T25121A Burn of first degree of right foot, initial encounter: Secondary | ICD-10-CM | POA: Diagnosis not present

## 2020-03-13 ENCOUNTER — Other Ambulatory Visit (HOSPITAL_COMMUNITY): Payer: Self-pay

## 2020-03-14 MED FILL — METHYLPHENIDATE HCL 10 MG T: 10 | 30 days supply | Qty: 60 | Fill #0

## 2020-04-30 ENCOUNTER — Other Ambulatory Visit (HOSPITAL_COMMUNITY): Payer: Self-pay

## 2020-04-30 MED FILL — METHYLPHENIDATE HCL 10 MG T: 10 | 30 days supply | Qty: 60 | Fill #0

## 2020-06-09 ENCOUNTER — Other Ambulatory Visit (HOSPITAL_COMMUNITY): Payer: Self-pay

## 2020-06-10 MED FILL — METHYLPHENIDATE HCL 10 MG T: 10 | 30 days supply | Qty: 60 | Fill #0

## 2020-06-10 MED FILL — ATORVASTATIN 40 MG TABLET: 40 | 90 days supply | Qty: 90 | Fill #2

## 2020-06-10 MED FILL — OMEPRAZOLE 20 MG CAP: 20 | 90 days supply | Qty: 90 | Fill #2

## 2020-07-21 ENCOUNTER — Other Ambulatory Visit (HOSPITAL_COMMUNITY): Payer: Self-pay | Admitting: Physician Assistant

## 2020-07-21 DIAGNOSIS — J4520 Mild intermittent asthma, uncomplicated: Secondary | ICD-10-CM | POA: Diagnosis not present

## 2020-07-21 MED FILL — PREDNISONE 10 MG (48) TBPK: 10 | 12 days supply | Qty: 48 | Fill #0

## 2020-07-21 MED FILL — DOXYCYCLINE MONO 100 MG CAP: 100 | 10 days supply | Qty: 20 | Fill #0

## 2020-07-21 MED FILL — ALBUTEROL SULFATE HFA 108 (: 108 (90 BAS | 16 days supply | Qty: 18 | Fill #0

## 2020-07-21 MED FILL — BENZONATATE 100 MG CAPS: 100 | 10 days supply | Qty: 30 | Fill #0

## 2020-08-04 ENCOUNTER — Other Ambulatory Visit: Payer: Self-pay

## 2020-08-05 ENCOUNTER — Other Ambulatory Visit (HOSPITAL_COMMUNITY): Payer: Self-pay

## 2020-08-06 ENCOUNTER — Other Ambulatory Visit (HOSPITAL_COMMUNITY): Payer: Self-pay

## 2020-08-06 MED FILL — Albuterol Sulfate Inhal Aero 108 MCG/ACT (90MCG Base Equiv): RESPIRATORY_TRACT | 25 days supply | Qty: 18 | Fill #0 | Status: CN

## 2020-08-14 ENCOUNTER — Other Ambulatory Visit (HOSPITAL_COMMUNITY): Payer: Self-pay

## 2020-08-15 ENCOUNTER — Other Ambulatory Visit (HOSPITAL_COMMUNITY): Payer: Self-pay

## 2020-08-19 ENCOUNTER — Other Ambulatory Visit (HOSPITAL_COMMUNITY): Payer: Self-pay

## 2020-08-21 ENCOUNTER — Other Ambulatory Visit (HOSPITAL_COMMUNITY): Payer: Self-pay

## 2020-08-22 ENCOUNTER — Other Ambulatory Visit (HOSPITAL_COMMUNITY): Payer: Self-pay

## 2020-08-22 MED ORDER — METHYLPHENIDATE HCL 10 MG PO TABS
10.0000 mg | ORAL_TABLET | Freq: Two times a day (BID) | ORAL | 0 refills | Status: AC
  Filled 2020-08-22: qty 60, 30d supply, fill #0

## 2020-09-01 ENCOUNTER — Other Ambulatory Visit (HOSPITAL_COMMUNITY): Payer: Self-pay

## 2020-09-01 DIAGNOSIS — R0982 Postnasal drip: Secondary | ICD-10-CM | POA: Diagnosis not present

## 2020-09-01 DIAGNOSIS — R053 Chronic cough: Secondary | ICD-10-CM | POA: Diagnosis not present

## 2020-09-01 DIAGNOSIS — Z801 Family history of malignant neoplasm of trachea, bronchus and lung: Secondary | ICD-10-CM | POA: Diagnosis not present

## 2020-09-01 DIAGNOSIS — R059 Cough, unspecified: Secondary | ICD-10-CM | POA: Diagnosis not present

## 2020-09-01 DIAGNOSIS — J302 Other seasonal allergic rhinitis: Secondary | ICD-10-CM | POA: Diagnosis not present

## 2020-09-01 MED ORDER — BREO ELLIPTA 200-25 MCG/INH IN AEPB
1.0000 | INHALATION_SPRAY | Freq: Every day | RESPIRATORY_TRACT | 3 refills | Status: AC
  Filled 2020-09-01: qty 60, 30d supply, fill #0
  Filled 2020-10-16: qty 60, 30d supply, fill #1
  Filled 2020-12-17: qty 60, 30d supply, fill #2

## 2020-09-10 ENCOUNTER — Other Ambulatory Visit (HOSPITAL_COMMUNITY): Payer: Self-pay

## 2020-09-10 MED FILL — Omeprazole Cap Delayed Release 20 MG: ORAL | 90 days supply | Qty: 90 | Fill #0 | Status: AC

## 2020-09-10 MED FILL — Atorvastatin Calcium Tab 40 MG (Base Equivalent): ORAL | 90 days supply | Qty: 90 | Fill #0 | Status: AC

## 2020-09-11 ENCOUNTER — Other Ambulatory Visit (HOSPITAL_COMMUNITY): Payer: Self-pay

## 2020-10-01 DIAGNOSIS — Z7689 Persons encountering health services in other specified circumstances: Secondary | ICD-10-CM | POA: Diagnosis not present

## 2020-10-02 ENCOUNTER — Other Ambulatory Visit (HOSPITAL_COMMUNITY): Payer: Self-pay

## 2020-10-02 DIAGNOSIS — R053 Chronic cough: Secondary | ICD-10-CM | POA: Diagnosis not present

## 2020-10-02 DIAGNOSIS — J4521 Mild intermittent asthma with (acute) exacerbation: Secondary | ICD-10-CM | POA: Diagnosis not present

## 2020-10-02 MED ORDER — FLUCONAZOLE 200 MG PO TABS
200.0000 mg | ORAL_TABLET | Freq: Every day | ORAL | 0 refills | Status: DC
  Filled 2020-10-02: qty 10, 10d supply, fill #0

## 2020-10-02 MED ORDER — LEVOFLOXACIN 500 MG PO TABS
ORAL_TABLET | ORAL | 0 refills | Status: DC
  Filled 2020-10-02: qty 7, 7d supply, fill #0

## 2020-10-02 MED ORDER — METHYLPREDNISOLONE 4 MG PO TBPK
ORAL_TABLET | ORAL | 0 refills | Status: DC
  Filled 2020-10-02: qty 21, 6d supply, fill #0

## 2020-10-16 ENCOUNTER — Other Ambulatory Visit (HOSPITAL_COMMUNITY): Payer: Self-pay

## 2020-10-23 ENCOUNTER — Other Ambulatory Visit (HOSPITAL_COMMUNITY): Payer: Self-pay

## 2020-11-05 ENCOUNTER — Other Ambulatory Visit (HOSPITAL_COMMUNITY): Payer: Self-pay

## 2020-11-05 DIAGNOSIS — E669 Obesity, unspecified: Secondary | ICD-10-CM | POA: Diagnosis not present

## 2020-11-05 DIAGNOSIS — Z114 Encounter for screening for human immunodeficiency virus [HIV]: Secondary | ICD-10-CM | POA: Diagnosis not present

## 2020-11-05 DIAGNOSIS — Z0001 Encounter for general adult medical examination with abnormal findings: Secondary | ICD-10-CM | POA: Diagnosis not present

## 2020-11-05 DIAGNOSIS — F320 Major depressive disorder, single episode, mild: Secondary | ICD-10-CM | POA: Diagnosis not present

## 2020-11-05 DIAGNOSIS — Z6830 Body mass index (BMI) 30.0-30.9, adult: Secondary | ICD-10-CM | POA: Diagnosis not present

## 2020-11-05 DIAGNOSIS — Z0000 Encounter for general adult medical examination without abnormal findings: Secondary | ICD-10-CM | POA: Diagnosis not present

## 2020-11-05 DIAGNOSIS — Z79899 Other long term (current) drug therapy: Secondary | ICD-10-CM | POA: Diagnosis not present

## 2020-11-05 DIAGNOSIS — F413 Other mixed anxiety disorders: Secondary | ICD-10-CM | POA: Diagnosis not present

## 2020-11-05 DIAGNOSIS — E785 Hyperlipidemia, unspecified: Secondary | ICD-10-CM | POA: Diagnosis not present

## 2020-11-05 MED ORDER — BUPROPION HCL ER (XL) 300 MG PO TB24
300.0000 mg | ORAL_TABLET | Freq: Every day | ORAL | 0 refills | Status: DC
  Filled 2020-11-05: qty 90, 90d supply, fill #0

## 2020-11-05 MED ORDER — DESVENLAFAXINE SUCCINATE ER 50 MG PO TB24
50.0000 mg | ORAL_TABLET | Freq: Every day | ORAL | 2 refills | Status: DC
  Filled 2020-11-05: qty 30, 30d supply, fill #0
  Filled 2020-12-02: qty 30, 30d supply, fill #1
  Filled 2021-01-08: qty 30, 30d supply, fill #2

## 2020-12-03 ENCOUNTER — Other Ambulatory Visit (HOSPITAL_COMMUNITY): Payer: Self-pay

## 2020-12-17 ENCOUNTER — Other Ambulatory Visit (HOSPITAL_COMMUNITY): Payer: Self-pay

## 2020-12-17 MED ORDER — ATORVASTATIN CALCIUM 40 MG PO TABS
40.0000 mg | ORAL_TABLET | Freq: Every day | ORAL | 3 refills | Status: DC
  Filled 2020-12-17: qty 90, 90d supply, fill #0
  Filled 2021-03-22: qty 90, 90d supply, fill #1
  Filled 2021-06-30: qty 90, 90d supply, fill #2
  Filled 2021-09-28: qty 90, 90d supply, fill #3

## 2020-12-17 MED ORDER — OMEPRAZOLE 20 MG PO CPDR
20.0000 mg | DELAYED_RELEASE_CAPSULE | Freq: Every day | ORAL | 3 refills | Status: DC
  Filled 2020-12-17: qty 90, 90d supply, fill #0
  Filled 2021-03-22: qty 90, 90d supply, fill #1
  Filled 2021-06-30: qty 90, 90d supply, fill #2
  Filled 2021-09-28: qty 90, 90d supply, fill #3

## 2021-01-09 ENCOUNTER — Other Ambulatory Visit (HOSPITAL_COMMUNITY): Payer: Self-pay

## 2021-02-10 ENCOUNTER — Other Ambulatory Visit (HOSPITAL_COMMUNITY): Payer: Self-pay

## 2021-02-10 MED ORDER — DESVENLAFAXINE SUCCINATE ER 50 MG PO TB24
50.0000 mg | ORAL_TABLET | Freq: Every day | ORAL | 2 refills | Status: DC
  Filled 2021-02-10: qty 30, 30d supply, fill #0
  Filled 2021-03-22: qty 30, 30d supply, fill #1
  Filled 2021-04-27: qty 30, 30d supply, fill #2

## 2021-03-22 ENCOUNTER — Other Ambulatory Visit (HOSPITAL_COMMUNITY): Payer: Self-pay

## 2021-03-22 MED ORDER — BUPROPION HCL ER (XL) 300 MG PO TB24
300.0000 mg | ORAL_TABLET | Freq: Every day | ORAL | 0 refills | Status: AC
  Filled 2021-03-22: qty 90, 90d supply, fill #0

## 2021-04-27 MED FILL — Albuterol Sulfate Inhal Aero 108 MCG/ACT (90MCG Base Equiv): RESPIRATORY_TRACT | 25 days supply | Qty: 18 | Fill #0 | Status: AC

## 2021-04-28 ENCOUNTER — Other Ambulatory Visit (HOSPITAL_COMMUNITY): Payer: Self-pay

## 2021-04-28 DIAGNOSIS — Z20822 Contact with and (suspected) exposure to covid-19: Secondary | ICD-10-CM | POA: Diagnosis not present

## 2021-04-30 ENCOUNTER — Other Ambulatory Visit: Payer: Self-pay

## 2021-04-30 ENCOUNTER — Emergency Department (INDEPENDENT_AMBULATORY_CARE_PROVIDER_SITE_OTHER)
Admission: RE | Admit: 2021-04-30 | Discharge: 2021-04-30 | Disposition: A | Discharge status: Home / Self Care | Payer: 59 | Source: Ambulatory Visit | Attending: Family Medicine | Admitting: Family Medicine

## 2021-04-30 ENCOUNTER — Other Ambulatory Visit (HOSPITAL_COMMUNITY): Payer: Self-pay

## 2021-04-30 VITALS — BP 108/73 | HR 105 | Temp 98.7°F | Resp 18 | Ht 66.0 in | Wt 190.0 lb

## 2021-04-30 DIAGNOSIS — J069 Acute upper respiratory infection, unspecified: Secondary | ICD-10-CM | POA: Diagnosis not present

## 2021-04-30 LAB — POCT INFLUENZA A/B
Influenza A, POC: NEGATIVE
Influenza B, POC: NEGATIVE

## 2021-04-30 LAB — POC SARS CORONAVIRUS 2 AG -  ED: SARS Coronavirus 2 Ag: NEGATIVE

## 2021-04-30 MED ORDER — PREDNISONE 20 MG PO TABS
ORAL_TABLET | ORAL | 0 refills | Status: DC
  Filled 2021-04-30: qty 11, 3d supply, fill #0

## 2021-04-30 MED ORDER — DOXYCYCLINE HYCLATE 100 MG PO CAPS: ORAL_CAPSULE | ORAL | 0 refills | Status: AC

## 2021-04-30 NOTE — ED Provider Notes (Signed)
[facility]    CSN: [id number] Arrival date & time: 04/30/21  1156      History   Chief Complaint Chief Complaint  Patient presents with   Sore Throat    Sore throat, fever, cough. X1 week Fever x4 days    HPI Todd Velazquez is a 51 y.o. male.   Patient complains of one week history of typical cold-like symptoms developing over several days, including mild sore throat, sinus congestion, headache, myalgias, fatigue, and cough. Last night he had fever to 101, and nausea without vomiting.  He has had two negative home COVID tests and a negative PCR test two days ago.  He has a history of mild reactive airways disease that results in slowly resolving viral URI's.  The history is provided by the patient.   Past Medical History:  Diagnosis Date   Hypercholesteremia     There are no problems to display for this patient.   History reviewed. No pertinent surgical history.     Home Medications    Prior to Admission medications   Medication Sig Start Date End Date Taking? Authorizing Provider  albuterol (VENTOLIN HFA) 108 (90 Base) MCG/ACT inhaler INHALE 2 PUFFS INTO THE LUNGS EVERY 6 HOURS AS NEEDED FOR WHEEZING. 07/21/20 07/21/21 Yes Secrest, Carlyle Dolly, PA  atorvastatin (LIPITOR) 40 MG tablet Take 40 mg by mouth daily. 11/11/19  Yes [provider]  buPROPion (WELLBUTRIN XL) 300 MG 24 hr tablet Take 300 mg by mouth daily. 11/05/19  Yes [provider]  desvenlafaxine (PRISTIQ) 50 MG 24 hr tablet Take 1 tablet (50 mg total) by mouth daily. 02/10/21  Yes   doxycycline (VIBRAMYCIN) 100 MG capsule Take one cap PO Q12hr with food. 04/30/21  Yes Kandra Nicolas, MD  fluticasone (FLONASE) 50 MCG/ACT nasal spray Place into both nostrils daily.   Yes [provider]  omeprazole (PRILOSEC) 20 MG capsule Take 20 mg by mouth daily.   Yes [provider]  predniSONE (DELTASONE) 20 MG tablet Take one tab by mouth twice daily for 4 days, then one  daily for 3 days. Take with food. 04/30/21  Yes Kandra Nicolas, MD  albuterol (PROVENTIL HFA;VENTOLIN HFA) 108 (90 Base) MCG/ACT inhaler Inhale 1-2 puffs into the lungs every 6 (six) hours as needed for wheezing or shortness of breath. 08/02/16   Multani, Bhupinder, NP  atorvastatin (LIPITOR) 40 MG tablet TAKE 1 TABLET BY MOUTH DAILY. 11/11/19 12/09/20  Gara Kroner, DO  atorvastatin (LIPITOR) 40 MG tablet Take 1 tablet (40 mg total) by mouth daily. 12/17/20     atorvastatin (LIPITOR) 80 MG tablet Take 80 mg by mouth daily.    [provider]  benzonatate (TESSALON) 100 MG capsule TAKE 1 CAPSULE BY MOUTH 3 TIMES DAILY AS NEEDED FOR UP TO 10 DAYS. 07/21/20 07/21/21  Secrest, Carlyle Dolly, PA  buPROPion (WELLBUTRIN XL) 300 MG 24 hr tablet TAKE 1 TABLET BY MOUTH ONCE DAILY 02/24/20 02/23/21  Gara Kroner, DO  buPROPion (WELLBUTRIN XL) 300 MG 24 hr tablet Take 1 tablet (300 mg total) by mouth daily. 03/22/21     BuPROPion HCl (WELLBUTRIN PO) Take by mouth.    [provider]  fluticasone furoate-vilanterol (BREO ELLIPTA) 200-25 MCG/INH AEPB Inhale 1 puff into the lungs daily. 09/01/20     methylphenidate (RITALIN) 10 MG tablet TAKE 1 TABLET BY MOUTH 2 TIMES DAILY 06/09/20 12/06/20    methylphenidate (RITALIN) 10 MG tablet TAKE 1 TABLET BY MOUTH 2 TIMES  DAILY 04/30/20 10/27/20    methylphenidate (RITALIN) 10 MG tablet TAKE 1 TABLET BY MOUTH 2 TIMES A DAY 03/13/20 09/09/20    methylphenidate (RITALIN) 10 MG tablet Take 1 tablet (10 mg total) by mouth 2 (two) times daily. 08/21/20     mupirocin ointment (BACTROBAN) 2 % Apply to wound 3 times daily for 5 days 11/20/19   Noe Gens, PA-C  omeprazole (PRILOSEC) 20 MG capsule TAKE 1 CAPSULE BY MOUTH DAILY. 11/11/19 12/09/20  Gara Kroner, DO  omeprazole (PRILOSEC) 20 MG capsule Take 1 capsule (20 mg total) by mouth daily. 12/17/20       Family History History reviewed. No pertinent family history.  Social History Social History   Tobacco Use    Smoking status: Never   Smokeless tobacco: Never  Substance Use Topics   Alcohol use: Not Currently    Comment: occasonally     Allergies   Codeine   Review of Systems Review of Systems   Physical Exam Triage Vital Signs ED Triage Vitals  Enc Vitals Group     BP 04/30/21 1233 108/73     Pulse Rate 04/30/21 1233 (!) 105     Resp 04/30/21 1233 18     Temp 04/30/21 1233 98.7 F (37.1 C)     Temp Source 04/30/21 1233 Oral     SpO2 04/30/21 1233 96 %     Weight 04/30/21 1230 190 lb (86.2 kg)     Height 04/30/21 1230 5\' 6"  (1.676 m)     Head Circumference --      Peak Flow --      Pain Score 04/30/21 1229 6     Pain Loc --      Pain Edu? --      Excl. in [place]? --    No data found.  Updated Vital Signs BP 108/73 (BP Location: Left Arm)    Pulse (!) 105    Temp 98.7 F (37.1 C) (Oral)    Resp 18    Ht 5\' 6"  (1.676 m)    Wt 86.2 kg    SpO2 96%    BMI 30.67 kg/m   Visual Acuity Right Eye Distance:   Left Eye Distance:   Bilateral Distance:    Right Eye Near:   Left Eye Near:    Bilateral Near:     Physical Exam + sore throat + cough No pleuritic pain No wheezing + nasal congestion + post-nasal drainage No sinus pain/pressure No itchy/red eyes ? earache No hemoptysis No SOB + fever, + chills + nausea No vomiting No abdominal pain No diarrhea No urinary symptoms No skin rash + fatigue + myalgias + headache Used OTC meds (Mucinex syrup and ibuprofen) without relief   UC Treatments / Results  Labs (all labs ordered are listed, but only abnormal results are displayed) Labs Reviewed  POCT INFLUENZA A/B - Normal  POC SARS CORONAVIRUS 2 AG -  ED - Normal    EKG   Radiology No results found.  Procedures Procedures (including critical care time)  Medications Ordered in UC Medications - No data to display  Initial Impression / Assessment and Plan / UC Course  I have reviewed the triage vital signs and the nursing notes.  Pertinent labs &  imaging results that were available during my care of the patient were reviewed by me and considered in my medical decision making (see chart for details).    Benign exam.  Begin prednisone burst/taper.  Begin doxycycline  if not improving in about 5 to 7 days. Followup with Family Doctor if not improved in about 10 days.  Final Clinical Impressions(s) / UC Diagnoses   Final diagnoses:  Viral URI with cough     Discharge Instructions      Continue albuterol inhaler as needed. Take plain guaifenesin (1200mg  extended release tabs such as Mucinex) twice daily, with plenty of water, for cough and congestion.  May add Pseudoephedrine (30mg , one or two every 4 to 6 hours) for sinus congestion.  Get adequate rest.   May continue Flonase nasal spray. Try warm salt water gargles for sore throat.  Stop all antihistamines for now, and other non-prescription cough/cold preparations. May take Tylenol as needed for fever, headache, etc. Begin doxycycline if not improving about 5 to 7 days or if persistent fever develops (Given a prescription to hold, with an expiration date)     ED Prescriptions     Medication Sig Dispense Auth. Provider   predniSONE (DELTASONE) 20 MG tablet Take one tab by mouth twice daily for 4 days, then one daily for 3 days. Take with food. 11 tablet Kandra Nicolas, MD   doxycycline (VIBRAMYCIN) 100 MG capsule Take one cap PO Q12hr with food. 14 capsule Kandra Nicolas, MD         Kandra Nicolas, MD 05/01/21 1539

## 2021-04-30 NOTE — Discharge Instructions (Signed)
Continue albuterol inhaler as needed. Take plain guaifenesin (1200mg  extended release tabs such as Mucinex) twice daily, with plenty of water, for cough and congestion.  May add Pseudoephedrine (30mg , one or two every 4 to 6 hours) for sinus congestion.  Get adequate rest.   May continue Flonase nasal spray. Try warm salt water gargles for sore throat.  Stop all antihistamines for now, and other non-prescription cough/cold preparations. May take Tylenol as needed for fever, headache, etc. Begin doxycycline if not improving about 5 to 7 days or if persistent fever develops

## 2021-04-30 NOTE — ED Triage Notes (Signed)
Pt states that he has a sore throat, fever, and cough.  X1 week  Pt states that he has had the fever x4 days.  Pt states that he is vaccinated for covid. Pt states that he has had flu vacci

## 2021-05-27 ENCOUNTER — Other Ambulatory Visit (HOSPITAL_COMMUNITY): Payer: Self-pay

## 2021-05-28 ENCOUNTER — Other Ambulatory Visit (HOSPITAL_COMMUNITY): Payer: Self-pay

## 2021-05-28 MED ORDER — DESVENLAFAXINE SUCCINATE ER 50 MG PO TB24
50.0000 mg | ORAL_TABLET | Freq: Every day | ORAL | 2 refills | Status: DC
  Filled 2021-05-28: qty 30, 30d supply, fill #0
  Filled 2021-06-30: qty 30, 30d supply, fill #1
  Filled 2021-08-11: qty 30, 30d supply, fill #2

## 2021-07-01 ENCOUNTER — Other Ambulatory Visit (HOSPITAL_COMMUNITY): Payer: Self-pay

## 2021-07-30 DIAGNOSIS — R221 Localized swelling, mass and lump, neck: Secondary | ICD-10-CM | POA: Diagnosis not present

## 2021-07-30 DIAGNOSIS — L723 Sebaceous cyst: Secondary | ICD-10-CM | POA: Diagnosis not present

## 2021-08-06 DIAGNOSIS — R221 Localized swelling, mass and lump, neck: Secondary | ICD-10-CM | POA: Diagnosis not present

## 2021-08-12 ENCOUNTER — Other Ambulatory Visit (HOSPITAL_COMMUNITY): Payer: Self-pay

## 2021-08-17 DIAGNOSIS — L723 Sebaceous cyst: Secondary | ICD-10-CM | POA: Diagnosis not present

## 2021-08-26 DIAGNOSIS — R221 Localized swelling, mass and lump, neck: Secondary | ICD-10-CM | POA: Diagnosis not present

## 2021-09-15 ENCOUNTER — Other Ambulatory Visit (HOSPITAL_COMMUNITY): Payer: Self-pay

## 2021-09-15 MED ORDER — DESVENLAFAXINE SUCCINATE ER 50 MG PO TB24
50.0000 mg | ORAL_TABLET | Freq: Every day | ORAL | 2 refills | Status: AC
  Filled 2021-09-15: qty 30, 30d supply, fill #0
  Filled 2021-10-16 – 2021-10-20 (×2): qty 30, 30d supply, fill #1

## 2021-09-29 ENCOUNTER — Other Ambulatory Visit (HOSPITAL_COMMUNITY): Payer: Self-pay

## 2021-09-29 DIAGNOSIS — R221 Localized swelling, mass and lump, neck: Secondary | ICD-10-CM | POA: Diagnosis not present

## 2021-09-29 DIAGNOSIS — D170 Benign lipomatous neoplasm of skin and subcutaneous tissue of head, face and neck: Secondary | ICD-10-CM | POA: Diagnosis not present

## 2021-10-18 ENCOUNTER — Other Ambulatory Visit (HOSPITAL_COMMUNITY): Payer: Self-pay

## 2021-10-19 ENCOUNTER — Encounter (HOSPITAL_COMMUNITY): Payer: Self-pay | Admitting: Pharmacist

## 2021-10-19 ENCOUNTER — Other Ambulatory Visit (HOSPITAL_COMMUNITY): Payer: Self-pay

## 2021-10-20 ENCOUNTER — Ambulatory Visit
Admission: EM | Admit: 2021-10-20 | Discharge: 2021-10-20 | Disposition: A | Discharge status: Home / Self Care | Payer: 59 | Attending: Family Medicine | Admitting: Family Medicine

## 2021-10-20 ENCOUNTER — Other Ambulatory Visit: Payer: Self-pay

## 2021-10-20 ENCOUNTER — Encounter: Payer: Self-pay | Admitting: Emergency Medicine

## 2021-10-20 ENCOUNTER — Other Ambulatory Visit (HOSPITAL_COMMUNITY): Payer: Self-pay

## 2021-10-20 DIAGNOSIS — R300 Dysuria: Secondary | ICD-10-CM

## 2021-10-20 DIAGNOSIS — M545 Low back pain, unspecified: Secondary | ICD-10-CM

## 2021-10-20 DIAGNOSIS — J40 Bronchitis, not specified as acute or chronic: Secondary | ICD-10-CM

## 2021-10-20 DIAGNOSIS — J329 Chronic sinusitis, unspecified: Secondary | ICD-10-CM

## 2021-10-20 DIAGNOSIS — R509 Fever, unspecified: Secondary | ICD-10-CM | POA: Diagnosis not present

## 2021-10-20 LAB — POCT URINALYSIS DIP (MANUAL ENTRY)
Bilirubin, UA: NEGATIVE
Glucose, UA: NEGATIVE mg/dL
Ketones, POC UA: NEGATIVE mg/dL
Leukocytes, UA: NEGATIVE
Nitrite, UA: NEGATIVE
Protein Ur, POC: NEGATIVE mg/dL
Spec Grav, UA: 1.01 (ref 1.010–1.025)
Urobilinogen, UA: 0.2 E.U./dL
pH, UA: 6 (ref 5.0–8.0)

## 2021-10-20 LAB — POCT INFLUENZA A/B
Influenza A, POC: NEGATIVE
Influenza B, POC: NEGATIVE

## 2021-10-20 MED ORDER — PREDNISONE 20 MG PO TABS
40.0000 mg | ORAL_TABLET | Freq: Every day | ORAL | 0 refills | Status: AC
  Filled 2021-10-20: qty 10, 5d supply, fill #0

## 2021-10-20 MED ORDER — AMOXICILLIN-POT CLAVULANATE 875-125 MG PO TABS
1.0000 | ORAL_TABLET | Freq: Two times a day (BID) | ORAL | 0 refills | Status: AC
  Filled 2021-10-20: qty 20, 10d supply, fill #0

## 2021-10-20 NOTE — ED Provider Notes (Signed)
[facility]    CSN: [id number] Arrival date & time: 10/20/21  0802      History   Chief Complaint Chief Complaint  Patient presents with   Cough   Fever    HPI Todd Velazquez is a 51 y.o. male.   HPI Patient presents with a 3-week history of nasal congestion and a 1 day history of cough and fever and chills.  Patient reports cough and fever and chills developed overnight.  Cough is persistent.  Patient has a history of chemical induced asthma, although is not prescribed any chronic maintenance inhalers. He endorses fatigue. No active wheezing  or shortness of breath. For cough he takes Mucinex with temporal relief.  He is having lower back pain with a "burning sensation when he urinates" he is concerned for possible UTI. Denies concern for STI. 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 No history of pneumonia. He is a nonsmoker. Past Medical History:  Diagnosis Date   Hypercholesteremia     There are no problems to display for this patient.   History reviewed. No pertinent surgical history.     Home Medications    Prior to Admission medications   Medication Sig Start Date End Date Taking? Authorizing Provider  albuterol (PROVENTIL HFA;VENTOLIN HFA) 108 (90 Base) MCG/ACT inhaler Inhale 1-2 puffs into the lungs every 6 (six) hours as needed for wheezing or shortness of breath. 08/02/16  Yes Multani, Bhupinder, NP  amoxicillin-clavulanate (AUGMENTIN) 875-125 MG tablet Take 1 tablet by mouth 2 (two) times daily. 10/20/21  Yes Scot Jun, FNP  atorvastatin (LIPITOR) 40 MG tablet Take 40 mg by mouth daily. 11/11/19  Yes [provider]  buPROPion (WELLBUTRIN XL) 300 MG 24 hr tablet Take 300 mg by mouth daily. 11/05/19  Yes [provider]  desvenlafaxine (PRISTIQ) 50 MG 24 hr tablet Take 1 tablet by mouth daily. 09/15/21  Yes   fluticasone furoate-vilanterol (BREO ELLIPTA) 200-25 MCG/INH AEPB Inhale 1 puff into the lungs  daily. 09/01/20  Yes   methylphenidate (RITALIN) 10 MG tablet Take 1 tablet (10 mg total) by mouth 2 (two) times daily. 08/21/20  Yes   omeprazole (PRILOSEC) 20 MG capsule Take 20 mg by mouth daily.   Yes [provider]  predniSONE (DELTASONE) 20 MG tablet Take 2 tablets (40 mg total) by mouth daily with breakfast. 10/20/21  Yes Scot Jun, FNP  albuterol (VENTOLIN HFA) 108 (90 Base) MCG/ACT inhaler INHALE 2 PUFFS INTO THE LUNGS EVERY 6 HOURS AS NEEDED FOR WHEEZING. 07/21/20 07/21/21  Secrest, Carlyle Dolly, PA  atorvastatin (LIPITOR) 40 MG tablet TAKE 1 TABLET BY MOUTH DAILY. 11/11/19 12/09/20  Gara Kroner, DO  atorvastatin (LIPITOR) 40 MG tablet Take 1 tablet (40 mg total) by mouth daily. 12/17/20     atorvastatin (LIPITOR) 80 MG tablet Take 80 mg by mouth daily.    [provider]  buPROPion (WELLBUTRIN XL) 300 MG 24 hr tablet TAKE 1 TABLET BY MOUTH ONCE DAILY 02/24/20 02/23/21  Joyce Gross C, DO  buPROPion (WELLBUTRIN XL) 300 MG 24 hr tablet Take 1 tablet (300 mg total) by mouth daily. 03/22/21     BuPROPion HCl (WELLBUTRIN PO) Take by mouth.    [provider]  doxycycline (VIBRAMYCIN) 100 MG capsule Take one cap PO Q12hr with food. 04/30/21   Kandra Nicolas, MD  fluticasone (FLONASE) 50 MCG/ACT nasal spray Place into both nostrils daily.    [provider]  methylphenidate (  RITALIN) 10 MG tablet TAKE 1 TABLET BY MOUTH 2 TIMES DAILY 06/09/20 12/06/20    methylphenidate (RITALIN) 10 MG tablet TAKE 1 TABLET BY MOUTH 2 TIMES DAILY 04/30/20 10/27/20    methylphenidate (RITALIN) 10 MG tablet TAKE 1 TABLET BY MOUTH 2 TIMES A DAY 03/13/20 09/09/20    mupirocin ointment (BACTROBAN) 2 % Apply to wound 3 times daily for 5 days 11/20/19   Noe Gens, PA-C  omeprazole (PRILOSEC) 20 MG capsule TAKE 1 CAPSULE BY MOUTH DAILY. 11/11/19 12/09/20  Gara Kroner, DO  omeprazole (PRILOSEC) 20 MG capsule Take 1 capsule (20 mg total) by mouth daily. 12/17/20       Family  History History reviewed. No pertinent family history.  Social History Social History   Tobacco Use   Smoking status: Never   Smokeless tobacco: Never  Vaping Use   Vaping Use: Never used  Substance Use Topics   Alcohol use: Not Currently    Comment: occasonally   Drug use: Never     Allergies   Codeine  Review of Systems Review of Systems Pertinent negatives listed in HPI   Physical Exam Triage Vital Signs ED Triage Vitals  Enc Vitals Group     BP 10/20/21 0810 125/89     Pulse Rate 10/20/21 0810 (!) 108     Resp 10/20/21 0810 16     Temp 10/20/21 0810 98.6 F (37 C)     Temp Source 10/20/21 0810 Oral     SpO2 10/20/21 0810 97 %     Weight --      Height --      Head Circumference --      Peak Flow --      Pain Score 10/20/21 0809 4     Pain Loc --      Pain Edu? --      Excl. in [place]? --    No data found.  Updated Vital Signs BP 125/89   Pulse (!) 108   Temp 98.6 F (37 C) (Oral)   Resp 16   SpO2 97%   Visual Acuity Right Eye Distance:   Left Eye Distance:   Bilateral Distance:    Right Eye Near:   Left Eye Near:    Bilateral Near:     Physical Exam Constitutional:      Appearance: He is ill-appearing.  HENT:     Head: Normocephalic and atraumatic.     Right Ear: Tympanic membrane, ear canal and external ear normal. There is no impacted cerumen.     Left Ear: Tympanic membrane, ear canal and external ear normal. There is no impacted cerumen.     Nose: Congestion and rhinorrhea present.  Eyes:     Extraocular Movements: Extraocular movements intact.     Pupils: Pupils are equal, round, and reactive to light.  Cardiovascular:     Rate and Rhythm: Regular rhythm. Tachycardia present.  Pulmonary:     Effort: Pulmonary effort is normal.     Breath sounds: Rhonchi present.     Comments: Persistent croupy like cough present during exam  Musculoskeletal:     Cervical back: Normal range of motion and neck supple.     Lumbar back: Tenderness  present. No swelling. Normal range of motion.  Lymphadenopathy:     Cervical: No cervical adenopathy.  Skin:    General: Skin is warm and dry.     Capillary Refill: Capillary refill takes less than 2 seconds.  Neurological:  General: No focal deficit present.     Mental Status: He is alert and oriented to person, place, and time.  Psychiatric:        Mood and Affect: Mood normal.        Behavior: Behavior normal.      UC Treatments / Results  Labs (all labs ordered are listed, but only abnormal results are displayed) Labs Reviewed  POCT URINALYSIS DIP (MANUAL ENTRY) - Abnormal; Notable for the following components:      Result Value   Blood, UA trace-intact (*)    All other components within normal limits  POCT INFLUENZA A/B    EKG   Radiology No results found.  Procedures Procedures (including critical care time)  Medications Ordered in UC Medications - No data to display  Initial Impression / Assessment and Plan / UC Course  I have reviewed the triage vital signs and the nursing notes.  Pertinent labs & imaging results that were available during my care of the patient were reviewed by me and considered in my medical decision making (see chart for details).    Sinobronchitis , tx prednisone and Augmentin. Influenza test negative. Declined COVID, will consider self testing if symptoms do not improve. Continue Mucinex for cough. UA grossly unremarkable, recommend hydrate with fluids, follow-up with PCP or return here if dysuria symptoms persist. Hydrate well with fluids. If fever or body aches develop, take tylenol.  Return as needed.   Final Clinical Impressions(s) / UC Diagnoses   Final diagnoses:  Sinobronchitis  Dysuria  Bilateral low back pain without sciatica, unspecified chronicity  Fever and chills     Discharge Instructions      Urine is clear no signs of infection or RBC. Hydrate well with water. Take ibuprofen or tylenol as needed for fever  and body aches.    ED Prescriptions     Medication Sig Dispense Auth. Provider   predniSONE (DELTASONE) 20 MG tablet Take 2 tablets (40 mg total) by mouth daily with breakfast. 10 tablet Scot Jun, FNP   amoxicillin-clavulanate (AUGMENTIN) 875-125 MG tablet Take 1 tablet by mouth 2 (two) times daily. 20 tablet Scot Jun, FNP      PDMP not reviewed this encounter.   Scot Jun, FNP 10/21/21 1123

## 2021-10-20 NOTE — ED Triage Notes (Signed)
Pt states he has had sinus issues x 3 weeks that cleared up 3 days ago. He then developed a cough and a fever that started last night.

## 2021-10-20 NOTE — Discharge Instructions (Addendum)
Urine is clear no signs of infection or RBC. Hydrate well with water. Take ibuprofen or tylenol as needed for fever and body aches.

## 2021-11-10 ENCOUNTER — Other Ambulatory Visit (HOSPITAL_COMMUNITY): Payer: Self-pay

## 2021-11-10 DIAGNOSIS — Z0001 Encounter for general adult medical examination with abnormal findings: Secondary | ICD-10-CM | POA: Diagnosis not present

## 2021-11-10 DIAGNOSIS — J4520 Mild intermittent asthma, uncomplicated: Secondary | ICD-10-CM | POA: Diagnosis not present

## 2021-11-10 DIAGNOSIS — Z130 Encounter for screening for diseases of the blood and blood-forming organs and certain disorders involving the immune mechanism: Secondary | ICD-10-CM | POA: Diagnosis not present

## 2021-11-10 DIAGNOSIS — Z114 Encounter for screening for human immunodeficiency virus [HIV]: Secondary | ICD-10-CM | POA: Diagnosis not present

## 2021-11-10 DIAGNOSIS — F413 Other mixed anxiety disorders: Secondary | ICD-10-CM | POA: Diagnosis not present

## 2021-11-10 DIAGNOSIS — Z0000 Encounter for general adult medical examination without abnormal findings: Secondary | ICD-10-CM | POA: Diagnosis not present

## 2021-11-10 DIAGNOSIS — Z13228 Encounter for screening for other metabolic disorders: Secondary | ICD-10-CM | POA: Diagnosis not present

## 2021-11-10 DIAGNOSIS — F320 Major depressive disorder, single episode, mild: Secondary | ICD-10-CM | POA: Diagnosis not present

## 2021-11-10 DIAGNOSIS — R7303 Prediabetes: Secondary | ICD-10-CM | POA: Diagnosis not present

## 2021-11-10 DIAGNOSIS — Z1329 Encounter for screening for other suspected endocrine disorder: Secondary | ICD-10-CM | POA: Diagnosis not present

## 2021-11-10 DIAGNOSIS — E782 Mixed hyperlipidemia: Secondary | ICD-10-CM | POA: Diagnosis not present

## 2021-11-10 MED ORDER — BUPROPION HCL ER (XL) 300 MG PO TB24
300.0000 mg | ORAL_TABLET | Freq: Every day | ORAL | 0 refills | Status: DC
  Filled 2021-11-10: qty 90, 90d supply, fill #0

## 2021-11-10 MED ORDER — MONTELUKAST SODIUM 10 MG PO TABS
10.0000 mg | ORAL_TABLET | Freq: Every day | ORAL | 5 refills | Status: AC
  Filled 2021-11-10: qty 30, 30d supply, fill #0
  Filled 2022-01-11: qty 30, 30d supply, fill #1
  Filled 2022-04-09 (×2): qty 30, 30d supply, fill #2
  Filled 2022-05-28: qty 30, 30d supply, fill #3
  Filled 2022-06-27: qty 30, 30d supply, fill #4
  Filled 2022-07-25: qty 30, 30d supply, fill #5

## 2021-11-10 MED ORDER — DESVENLAFAXINE SUCCINATE ER 50 MG PO TB24
50.0000 mg | ORAL_TABLET | Freq: Every day | ORAL | 3 refills | Status: AC
  Filled 2021-11-10: qty 90, 90d supply, fill #0
  Filled 2022-02-25: qty 90, 90d supply, fill #1

## 2021-11-10 MED ORDER — ALBUTEROL SULFATE HFA 108 (90 BASE) MCG/ACT IN AERS
INHALATION_SPRAY | RESPIRATORY_TRACT | 1 refills | Status: DC
  Filled 2021-11-10: qty 6.7, 25d supply, fill #0
  Filled 2022-04-09 (×2): qty 6.7, 25d supply, fill #1

## 2021-11-13 ENCOUNTER — Other Ambulatory Visit (HOSPITAL_COMMUNITY): Payer: Self-pay

## 2022-01-11 ENCOUNTER — Other Ambulatory Visit (HOSPITAL_COMMUNITY): Payer: Self-pay

## 2022-01-11 MED ORDER — ATORVASTATIN CALCIUM 40 MG PO TABS
40.0000 mg | ORAL_TABLET | Freq: Every day | ORAL | 3 refills | Status: AC
  Filled 2022-01-11: qty 90, 90d supply, fill #0
  Filled 2022-04-09 (×2): qty 90, 90d supply, fill #1
  Filled 2022-06-27: qty 90, 90d supply, fill #2

## 2022-01-11 MED ORDER — OMEPRAZOLE 20 MG PO CPDR
20.0000 mg | DELAYED_RELEASE_CAPSULE | Freq: Every day | ORAL | 3 refills | Status: AC
  Filled 2022-01-11: qty 90, 90d supply, fill #0
  Filled 2022-04-09 (×2): qty 90, 90d supply, fill #1
  Filled 2022-06-27: qty 90, 90d supply, fill #2

## 2022-01-12 ENCOUNTER — Other Ambulatory Visit (HOSPITAL_COMMUNITY): Payer: Self-pay

## 2022-02-25 ENCOUNTER — Other Ambulatory Visit (HOSPITAL_COMMUNITY): Payer: Self-pay

## 2022-02-25 MED ORDER — DESVENLAFAXINE SUCCINATE ER 50 MG PO TB24
50.0000 mg | ORAL_TABLET | Freq: Every day | ORAL | 3 refills | Status: AC
Start: 2022-02-25 — End: ?
  Filled 2022-02-25: qty 90, 90d supply, fill #0
  Filled 2022-05-28: qty 90, 90d supply, fill #1

## 2022-02-25 MED ORDER — BUPROPION HCL ER (XL) 300 MG PO TB24
300.0000 mg | ORAL_TABLET | Freq: Every day | ORAL | 0 refills | Status: DC
  Filled 2022-02-25: qty 90, 90d supply, fill #0

## 2022-03-23 DIAGNOSIS — N410 Acute prostatitis: Secondary | ICD-10-CM | POA: Diagnosis not present

## 2022-04-09 ENCOUNTER — Other Ambulatory Visit (HOSPITAL_COMMUNITY): Payer: Self-pay

## 2022-05-28 ENCOUNTER — Other Ambulatory Visit (HOSPITAL_COMMUNITY): Payer: Self-pay

## 2022-05-30 ENCOUNTER — Other Ambulatory Visit (HOSPITAL_COMMUNITY): Payer: Self-pay

## 2022-05-30 ENCOUNTER — Other Ambulatory Visit: Payer: Self-pay

## 2022-05-30 MED ORDER — BUPROPION HCL ER (XL) 300 MG PO TB24
300.0000 mg | ORAL_TABLET | Freq: Every day | ORAL | 0 refills | Status: AC
  Filled 2022-05-30: qty 90, 90d supply, fill #0

## 2022-06-27 ENCOUNTER — Other Ambulatory Visit (HOSPITAL_COMMUNITY): Payer: Self-pay

## 2022-06-27 MED ORDER — ALBUTEROL SULFATE HFA 108 (90 BASE) MCG/ACT IN AERS
2.0000 | INHALATION_SPRAY | Freq: Four times a day (QID) | RESPIRATORY_TRACT | 1 refills | Status: AC | PRN
  Filled 2022-06-27: qty 6.7, 16d supply, fill #0

## 2022-06-28 ENCOUNTER — Other Ambulatory Visit: Payer: Self-pay

## 2022-08-04 ENCOUNTER — Other Ambulatory Visit (HOSPITAL_COMMUNITY): Payer: Self-pay
